# Patient Record
Sex: Male | Born: 1935 | Race: White | Hispanic: No
Health system: Southern US, Community
[De-identification: ages and names within clinical notes are randomized; demographics above are authoritative.]

---

## 2007-10-20 ENCOUNTER — Ambulatory Visit: Payer: Self-pay | Admitting: Unknown Physician Specialty

## 2007-10-20 ENCOUNTER — Other Ambulatory Visit: Payer: Self-pay

## 2007-10-21 ENCOUNTER — Ambulatory Visit: Payer: Self-pay | Admitting: Unknown Physician Specialty

## 2007-10-21 ENCOUNTER — Ambulatory Visit: Payer: Self-pay | Admitting: Oncology

## 2007-10-29 ENCOUNTER — Ambulatory Visit: Payer: Self-pay | Admitting: Oncology

## 2007-11-05 ENCOUNTER — Ambulatory Visit: Payer: Self-pay | Admitting: Oncology

## 2007-11-14 ENCOUNTER — Ambulatory Visit: Payer: Self-pay | Admitting: Oncology

## 2007-11-20 ENCOUNTER — Ambulatory Visit: Payer: Self-pay | Admitting: Oncology

## 2007-12-21 ENCOUNTER — Ambulatory Visit: Payer: Self-pay | Admitting: Oncology

## 2008-01-20 ENCOUNTER — Ambulatory Visit: Payer: Self-pay | Admitting: Oncology

## 2008-02-20 ENCOUNTER — Ambulatory Visit: Payer: Self-pay | Admitting: Oncology

## 2008-04-19 ENCOUNTER — Ambulatory Visit: Payer: Self-pay | Admitting: Oncology

## 2008-05-12 ENCOUNTER — Ambulatory Visit: Payer: Self-pay | Admitting: Oncology

## 2008-05-20 ENCOUNTER — Ambulatory Visit: Payer: Self-pay | Admitting: Oncology

## 2008-10-20 ENCOUNTER — Ambulatory Visit: Payer: Self-pay | Admitting: Radiation Oncology

## 2008-11-04 ENCOUNTER — Ambulatory Visit: Payer: Self-pay | Admitting: Unknown Physician Specialty

## 2008-11-12 ENCOUNTER — Ambulatory Visit: Payer: Self-pay | Admitting: Oncology

## 2008-11-19 ENCOUNTER — Ambulatory Visit: Payer: Self-pay | Admitting: Radiation Oncology

## 2009-04-19 ENCOUNTER — Ambulatory Visit: Payer: Self-pay | Admitting: Radiation Oncology

## 2009-05-12 ENCOUNTER — Ambulatory Visit: Payer: Self-pay | Admitting: Radiation Oncology

## 2009-05-20 ENCOUNTER — Ambulatory Visit: Payer: Self-pay | Admitting: Radiation Oncology

## 2009-10-03 ENCOUNTER — Ambulatory Visit: Payer: Self-pay | Admitting: Internal Medicine

## 2009-10-03 ENCOUNTER — Ambulatory Visit: Payer: Self-pay | Admitting: Oncology

## 2009-10-06 ENCOUNTER — Ambulatory Visit: Payer: Self-pay | Admitting: Oncology

## 2009-10-19 ENCOUNTER — Ambulatory Visit: Payer: Self-pay | Admitting: Unknown Physician Specialty

## 2009-10-20 ENCOUNTER — Ambulatory Visit: Payer: Self-pay | Admitting: Oncology

## 2009-10-20 ENCOUNTER — Ambulatory Visit: Payer: Self-pay | Admitting: Internal Medicine

## 2009-10-26 ENCOUNTER — Ambulatory Visit: Payer: Self-pay | Admitting: Unknown Physician Specialty

## 2009-11-22 ENCOUNTER — Ambulatory Visit: Payer: Self-pay | Admitting: Unknown Physician Specialty

## 2010-03-16 ENCOUNTER — Ambulatory Visit: Payer: Self-pay | Admitting: Unknown Physician Specialty

## 2010-03-28 ENCOUNTER — Ambulatory Visit: Payer: Self-pay | Admitting: Unknown Physician Specialty

## 2010-07-28 ENCOUNTER — Ambulatory Visit: Payer: Self-pay | Admitting: Oncology

## 2010-08-02 ENCOUNTER — Ambulatory Visit: Payer: Self-pay | Admitting: Oncology

## 2010-08-20 ENCOUNTER — Ambulatory Visit: Payer: Self-pay | Admitting: Oncology

## 2010-09-20 ENCOUNTER — Ambulatory Visit: Payer: Self-pay | Admitting: Oncology

## 2010-10-21 ENCOUNTER — Ambulatory Visit: Payer: Self-pay | Admitting: Oncology

## 2010-11-20 ENCOUNTER — Ambulatory Visit: Payer: Self-pay | Admitting: Oncology

## 2010-12-21 ENCOUNTER — Ambulatory Visit: Payer: Self-pay | Admitting: Oncology

## 2011-01-08 ENCOUNTER — Ambulatory Visit: Payer: Self-pay | Admitting: Oncology

## 2011-01-09 ENCOUNTER — Ambulatory Visit: Payer: Self-pay | Admitting: Oncology

## 2011-01-20 ENCOUNTER — Ambulatory Visit: Payer: Self-pay | Admitting: Oncology

## 2011-05-19 ENCOUNTER — Inpatient Hospital Stay: Payer: Self-pay | Admitting: Internal Medicine

## 2011-05-19 LAB — COMPREHENSIVE METABOLIC PANEL
Anion Gap: 13 (ref 7–16)
Bilirubin,Total: 0.7 mg/dL (ref 0.2–1.0)
Calcium, Total: 8.7 mg/dL (ref 8.5–10.1)
Chloride: 100 mmol/L (ref 98–107)
Co2: 29 mmol/L (ref 21–32)
Creatinine: 1.18 mg/dL (ref 0.60–1.30)
EGFR (African American): >60
EGFR (Non-African Amer.): >60
Glucose: 154 mg/dL — ABNORMAL HIGH (ref 65–99)
Osmolality: 292 (ref 275–301)
Potassium: 3.6 mmol/L (ref 3.5–5.1)
SGOT(AST): 22 U/L (ref 15–37)
SGPT (ALT): 15 U/L

## 2011-05-19 LAB — TROPONIN I: Troponin-I: <0.02 ng/mL

## 2011-05-19 LAB — CK TOTAL AND CKMB (NOT AT ARMC)
CK, Total: 121 U/L (ref 35–232)
CK-MB: 0.8 ng/mL (ref 0.5–3.6)

## 2011-05-19 LAB — CBC
HCT: 31.3 % — ABNORMAL LOW (ref 40.0–52.0)
MCV: 95 fL (ref 80–100)

## 2011-05-20 LAB — BASIC METABOLIC PANEL
Anion Gap: 10 (ref 7–16)
BUN: 22 mg/dL — ABNORMAL HIGH (ref 7–18)
Calcium, Total: 8 mg/dL — ABNORMAL LOW (ref 8.5–10.1)
Chloride: 106 mmol/L (ref 98–107)
Co2: 27 mmol/L (ref 21–32)
EGFR (African American): >60
EGFR (Non-African Amer.): >60

## 2011-05-20 LAB — CBC WITH DIFFERENTIAL/PLATELET
Basophil #: 0 10*3/uL (ref 0.0–0.1)
Basophil %: 0 %
Eosinophil #: 0 10*3/uL (ref 0.0–0.7)
Eosinophil %: 0 %
HCT: 26.8 % — ABNORMAL LOW (ref 40.0–52.0)
Lymphocyte %: 4.1 %
MCH: 32.3 pg (ref 26.0–34.0)
MCHC: 33.8 g/dL (ref 32.0–36.0)
Monocyte #: 1.2 10*3/uL — ABNORMAL HIGH (ref 0.0–0.7)
Neutrophil #: 10.6 10*3/uL — ABNORMAL HIGH (ref 1.4–6.5)
Platelet: 114 10*3/uL — ABNORMAL LOW (ref 150–440)
RBC: 2.8 10*6/uL — ABNORMAL LOW (ref 4.40–5.90)
RDW: 14.6 % — ABNORMAL HIGH (ref 11.5–14.5)
WBC: 12.3 10*3/uL — ABNORMAL HIGH (ref 3.8–10.6)

## 2011-05-20 LAB — HEMOGLOBIN A1C: Hemoglobin A1C: 5.5 % (ref 4.2–6.3)

## 2011-05-21 ENCOUNTER — Ambulatory Visit: Payer: Self-pay | Admitting: Internal Medicine

## 2011-05-21 LAB — CBC WITH DIFFERENTIAL/PLATELET
Eosinophil #: 0 10*3/uL (ref 0.0–0.7)
HCT: 26.3 % — ABNORMAL LOW (ref 40.0–52.0)
Lymphocyte %: 7.7 %
MCH: 32.9 pg (ref 26.0–34.0)
MCHC: 33.7 g/dL (ref 32.0–36.0)
MCV: 98 fL (ref 80–100)
Neutrophil #: 5.8 10*3/uL (ref 1.4–6.5)
Neutrophil %: 81.5 %
RBC: 2.7 10*6/uL — ABNORMAL LOW (ref 4.40–5.90)
RDW: 14.6 % — ABNORMAL HIGH (ref 11.5–14.5)

## 2011-05-21 LAB — BASIC METABOLIC PANEL
Anion Gap: 9 (ref 7–16)
BUN: 18 mg/dL (ref 7–18)
Calcium, Total: 7.8 mg/dL — ABNORMAL LOW (ref 8.5–10.1)
Co2: 28 mmol/L (ref 21–32)
Creatinine: 0.77 mg/dL (ref 0.60–1.30)
Glucose: 103 mg/dL — ABNORMAL HIGH (ref 65–99)
Osmolality: 291 (ref 275–301)
Potassium: 2.7 mmol/L — ABNORMAL LOW (ref 3.5–5.1)
Sodium: 145 mmol/L (ref 136–145)

## 2011-05-22 LAB — BASIC METABOLIC PANEL
Anion Gap: 8 (ref 7–16)
BUN: 14 mg/dL (ref 7–18)
Calcium, Total: 7.7 mg/dL — ABNORMAL LOW (ref 8.5–10.1)
Chloride: 114 mmol/L — ABNORMAL HIGH (ref 98–107)
Co2: 24 mmol/L (ref 21–32)
Creatinine: 0.75 mg/dL (ref 0.60–1.30)
EGFR (African American): >60
EGFR (Non-African Amer.): >60
Glucose: 113 mg/dL — ABNORMAL HIGH (ref 65–99)
Osmolality: 292 (ref 275–301)

## 2011-05-22 LAB — EXPECTORATED SPUTUM ASSESSMENT W REFEX TO RESP CULTURE

## 2011-05-24 LAB — CULTURE, BLOOD (SINGLE)

## 2011-06-20 ENCOUNTER — Ambulatory Visit: Payer: Self-pay | Admitting: Internal Medicine

## 2011-06-20 DEATH — deceased

## 2012-04-19 IMAGING — CR DG CHEST 2V
1 series · 4 of 4 positions shown · non-contrast
Comparison: none

REASON FOR EXAM: Shortness of Breath
COMMENTS:   May transport without cardiac monitor

[Series 1: x chest ap · 0.14mm/px · 4 of 4 slices shown]
[im 1/4]
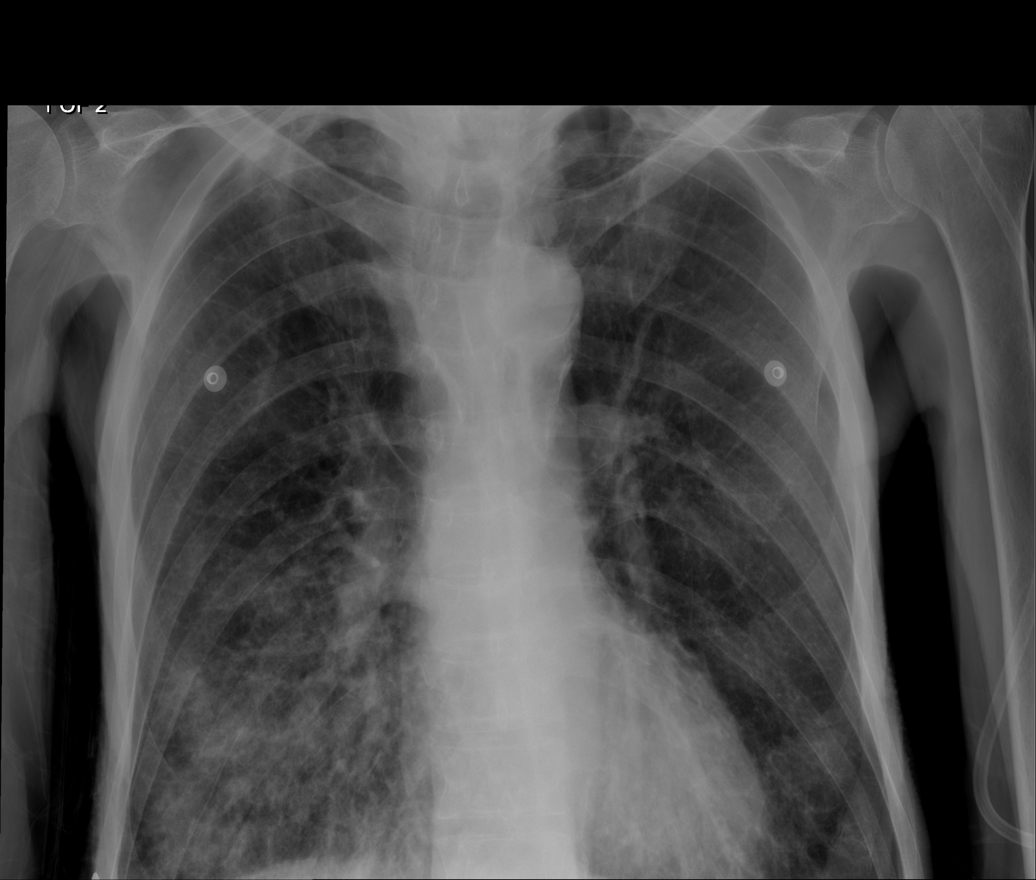
[im 2/4]
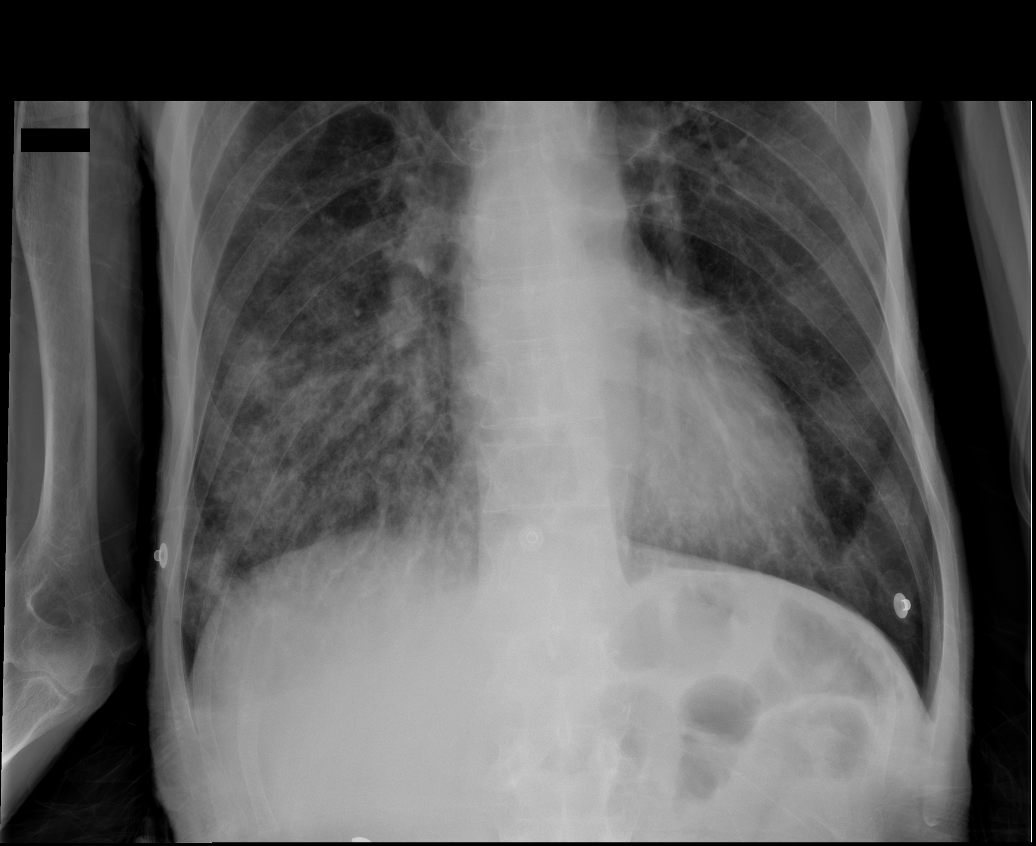
[im 3/4]
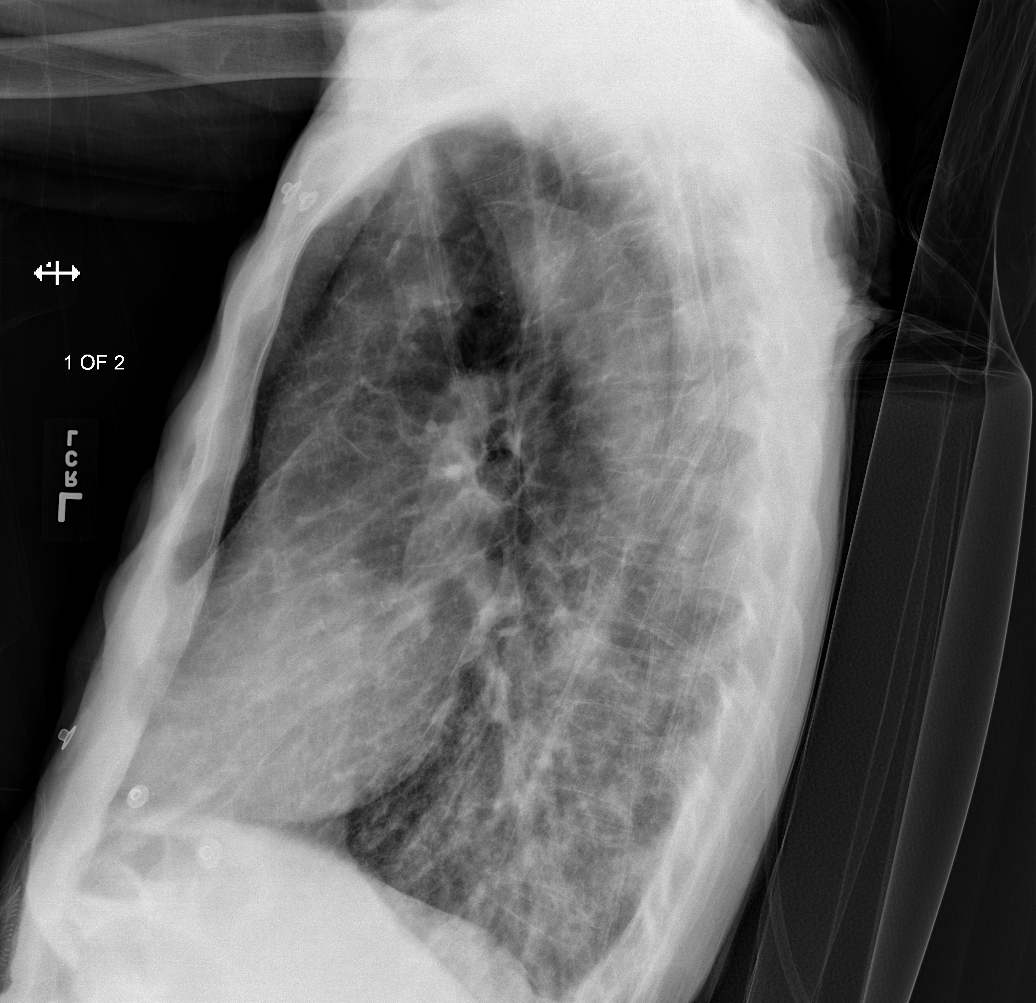
[im 4/4]
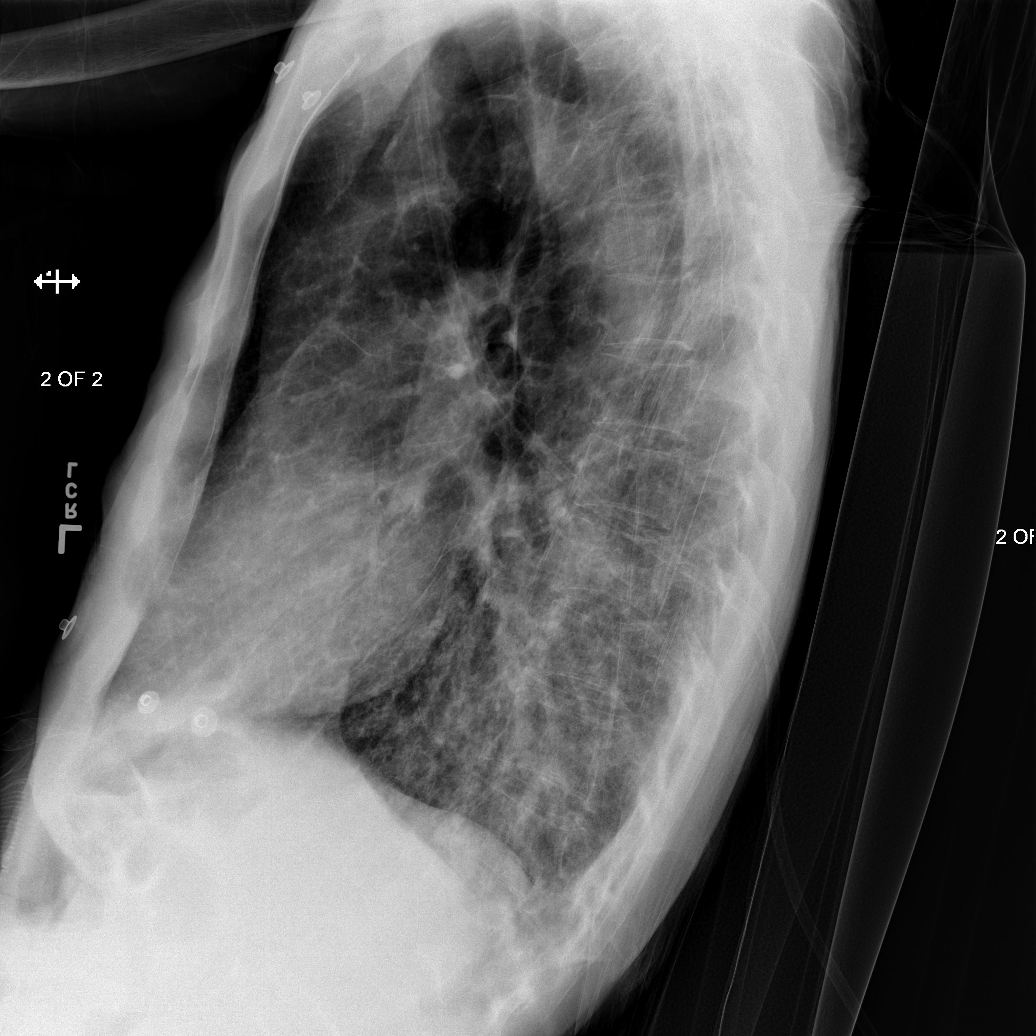

[4 of 4 positions shown; findings below may reference images not displayed]

PROCEDURE:     DXR - DXR CHEST PA (OR AP) AND LATERAL  - May 19, 2011  [DATE]

RESULT:     Comparison is made to the study 12 May, 2008.

The lungs are hyperinflated COPD changes present. The cardiac silhouette is
normal. Atherosclerotic calcification is noted within the aortic arch. Right
lower lobe pneumonia is present. There is no effusion.
IMPRESSION: 1. COPD with right lower lobe pneumonia.

## 2012-04-22 IMAGING — CR DG ABDOMEN 1V
1 series · 1 of 1 positions shown · non-contrast
Comparison: none

REASON FOR EXAM: Abdominal pain, upper quadrant rebound tenderness
COMMENTS:   Bedside (portable):Y

[ap]
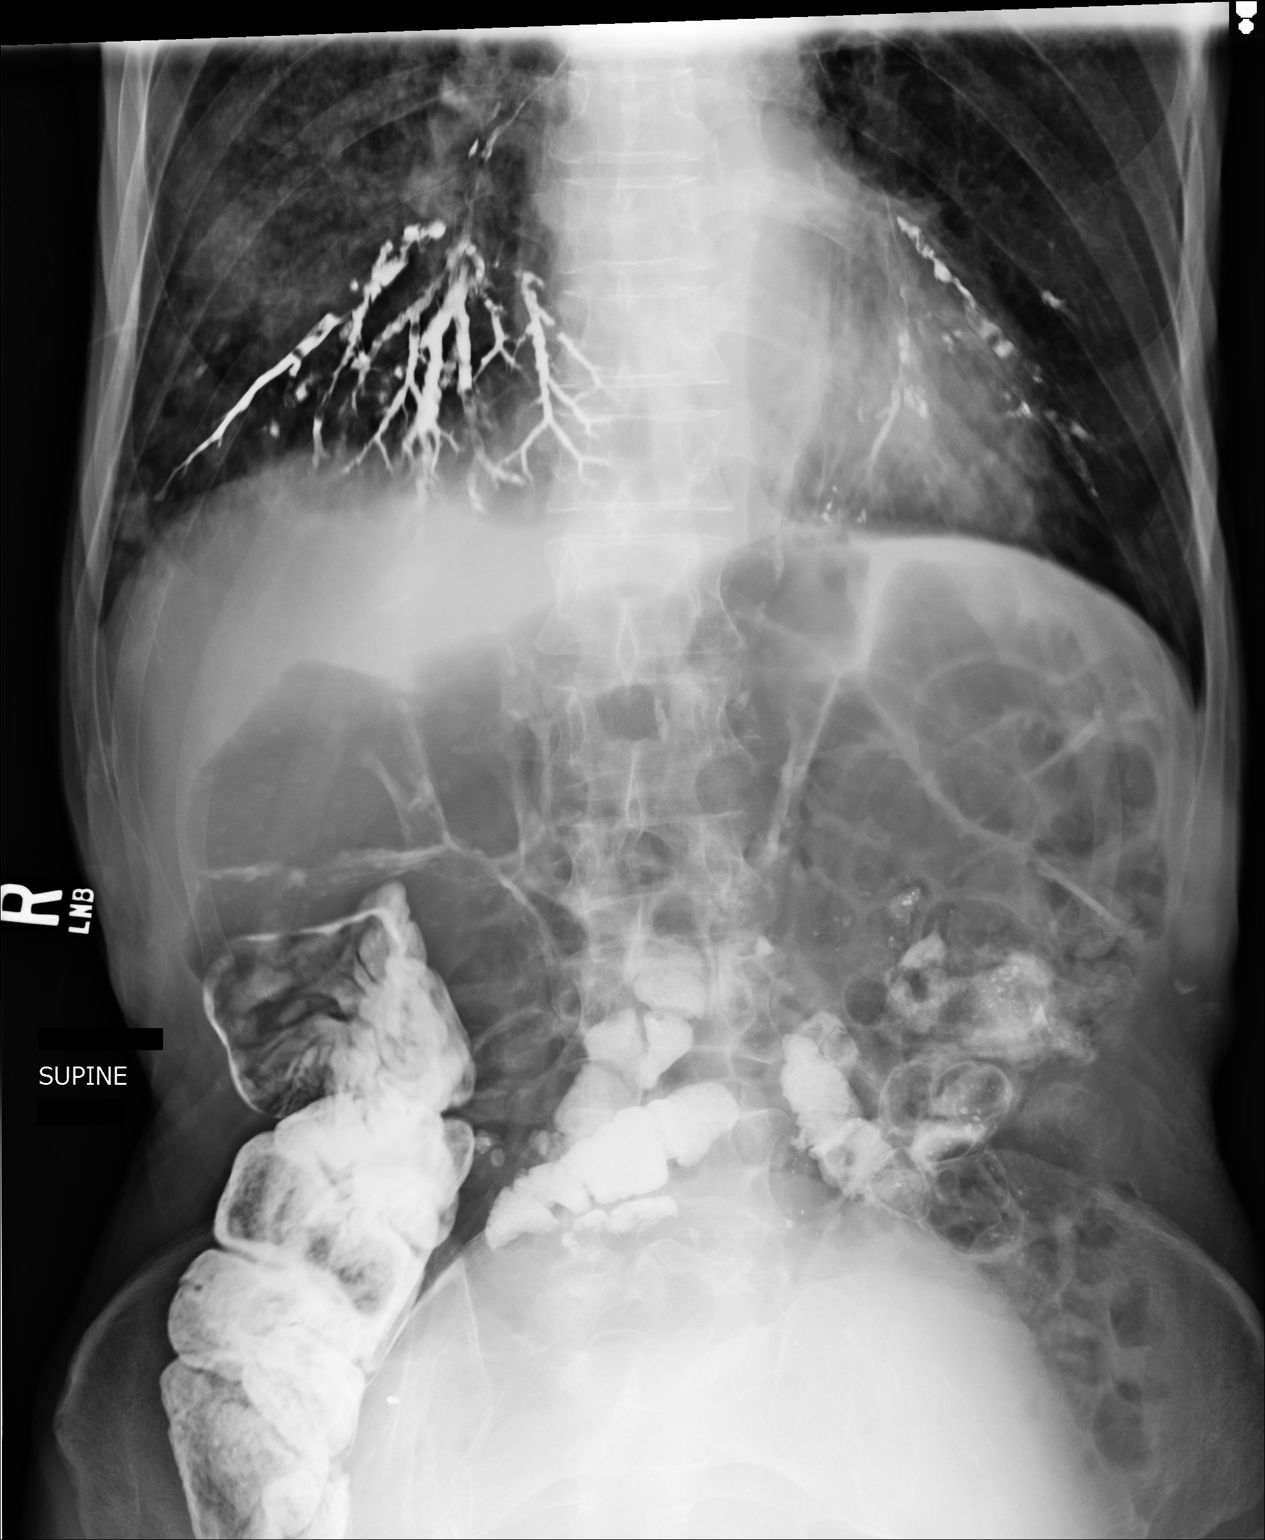

[1 of 1 positions shown; findings below may reference images not displayed]

PROCEDURE:     DXR - DXR ABDOMEN AP ONLY  - May 22, 2011  [DATE]

RESULT:     Barium is present within the bronchial structures in the lower
lobes. There some patchy increased density in the right lung base suggestive
of pneumonia. Aspiration pneumonia is not excluded. The barium from the
stomach the previous barium swallow is present within loops of bowel. There
is no definite obstruction. There are some air-filled loops of small bowel
present. Images obtained with the patient in a supine position and therefore
free air would be difficult to evaluate.
IMPRESSION: No definite bowel obstruction. There is barium in the right
colon and in loops of small bowel. Barium bronchogram right lower lobe
greater than left lower lobe. Right lung base pneumonia present.

## 2014-06-13 NOTE — Discharge Summary (Signed)
PATIENT NAME:  Travis Herman, Travis Herman DATE OF BIRTH:  04-Dec-1929  DATE OF ADMISSION:  05/19/2011 DATE OF DISCHARGE:  05/22/2011  ADMITTING PHYSICIAN: Alford Highlandichard Wieting, MD  DISCHARGING PHYSICIAN: Enid Baasadhika Deakin Lacek, MD  PRIMARY CARE PHYSICIAN: Dewaine Oatsenny Tate, MD  CONSULTANTS: Ned GraceNancy Phifer, MD - Palliative Care  DISCHARGE DIAGNOSES:  1. Systemic antiinflammatory response syndrome. 2. Aspiration pneumonia.  3. Severe dysphagia with frank aspiration on modified barium swallow study.  4. Urinary retention requiring Foley catheter.  5. Hypertension.  6. Gastroesophageal reflux disease.  7. Peripheral vascular disease.  8. Recurrent laryngeal cancer.  9. Constipation.  10. Chronic respiratory failure, on 2 liters home oxygen.  11. Chronic obstructive pulmonary disease.   DISCHARGE MEDICATIONS:  1. Roxanol 20 mg/mL, 5 to 10 mg p.o. sublingual every 1 to 2 hours p.r.n.  2. Ativan 0.5 to 1 mg p.o. sublingual every 2 to 4 hours p.r.n.  3. Ranitidine 150 mg p.o. twice a day. 4. ABHR suppository one per rectum every 4 to 6 hours p.r.n.  5. Augmentin 500 mg p.o. twice a day until 05/29/2011.  6. Scopolamine patch one patch transdermal every 3 days.  7. Flomax 0.4 mg p.o. daily.  8. Trazodone 100 mg p.o. at bedtime.  9. Metoprolol 25 mg p.o. twice a day.   DISCHARGE HOME OXYGEN: 2 liters.   DISCHARGE DIET: Pleasure feeds and sips of water as tolerated.  FOLLOWUP INSTRUCTIONS: The patient will be transferred to the Hospice Home.  LABS AND IMAGING STUDIES: Sodium 146, potassium 4.2, chloride 114, bicarbonate 24, BUN 14, creatinine 0.75, glucose 113, and calcium 7.7. WBC 7.1, hemoglobin 8.9, hematocrit 26.3, and platelet count 125.  Barium swallow study was incomplete because of frank aspiration with large amounts of barium.   Abdominal x-ray showed no bowel obstruction. Barium present in right colon and loops of small bowel.   Barium bronchogram with right lower lobe greater than  left lower lobe and right lung base pneumonia present.   Sputum cultures are growing Streptococcus agalactiae.   Hemoglobin A1c 5.5.   Blood cultures remained negative.   Chest x-ray on admission showed right lower lobe pneumonia and chronic obstructive pulmonary disease.  BRIEF HOSPITAL COURSE: Mr. Travis Herman is a 81 year old cachetic looking Caucasian male with past medical history of recurrent laryngeal cancer with hypertension, gastroesophageal reflux disease, and chronic respiratory failure on home oxygen who is followed at home by hospice. He came in for dysphagia and also cough. He was found to be in systemic antiinflammatory response syndrome with leukocytosis, tachypnea, and also possible pneumonia on chest x-ray.  1. Systemic inflammatory response syndrome secondary to aspiration pneumonia. He was placed on Zosyn and also azithromycin while in the hospital, and he is being discharged on Augmentin. Aspiration is going to be a major problem for him. He failed modified barium swallow study done in the hospital with frank aspiration of barium into the lungs. Palliative Care has seen the patient, and the patient is appropriately being discharged to the Hospice Home. The patient has refused a PEG. He was on p.o. in the hospital and can have pleasure feeds with sips of water as needed. 2. Urinary retention while in hospital. The patient has severe abdominal distention and pain and bladder scan showed greater than 1 liter of urine. Placing Foley catheter drained about 1500 mL of urine with improvement in abdominal pain. Abdominal x-ray did not show any acute findings. He is being started on Flomax.  3. Hypertension. The patient is on metoprolol at  the time of discharge.  4. Recurrent laryngeal cancer, status post chemoradiation and surgery with soft voice currently. He also has severe dysphagia causing him to have failure to thrive. The patient will be transferred to the Hospice Home today.  CODE  STATUS: DO NOT RESUSCITATE.   DISCHARGE CONDITION: Guarded with poor prognosis.   DISCHARGE DISPOSITION: Hospice Home.   TIME SPENT ON DISCHARGE: 45 minutes. ____________________________ Enid Baas, MD rk:slb D: 05/22/2011 12:13:13 ET T: 05/22/2011 17:10:05 ET JOB#: [id number]  cc: Enid Baas, MD, <Dictator> Jillene Bucks. Arlana Pouch, MD Enid Baas MD ELECTRONICALLY SIGNED 05/25/2011 13:48

## 2014-06-13 NOTE — H&P (Signed)
PATIENT NAME:  Travis Herman, Travis Herman MR#:  [id number] DATE OF BIRTH:  Oct 17, 1929  DATE OF ADMISSION:  05/19/2011  PRIMARY CARE PHYSICIAN: Dr. Arlana Pouch   CHIEF COMPLAINT: "I cannot breathe."  HISTORY OF PRESENT ILLNESS: This is a 81 year old man who is under hospice care for laryngeal cancer recurrence. He states that he cannot breathe for 3 or 4 days. He states that food gets caught, mostly solids. He is eating a regular diet. He has been fatigued. No fever, chills or sweats but does feel cold. In the Emergency Room he was found to have a right lower lobe pneumonia, leukocytosis and tachycardia and hospitalist services were contacted for further evaluation   PAST MEDICAL HISTORY:  1. Throat cancer status post radiation therapy then radiation and chemotherapy with recurrence and now under hospice care.  2. Hypertension. 3. Rapid heartbeat.  4. Gastroesophageal reflux disease.  5. Chronic pain. 6. Peripheral vascular disease.   PAST SURGICAL HISTORY:  1. Biopsies on the larynx.  2. Appendectomy.  3. AAA repair.   ALLERGIES: No known drug allergies.   MEDICATIONS:  1. Scopolamine patch every 72 hours. 2. Guaifenesin 600 mg 5 to 10 mL every 4 to 6 hours. 3. Oxycodone 10 mg every 4 to 6 hours. 4. Zyrtec 10 mg daily.  5. Gabapentin 100 mg 3 times a day.  6. Metoprolol 25 mg daily.  7. Senna Colace combination. 8. Diltiazem ER 180 mg daily.  9. Trazodone 100 mg at bedtime.  10. Oxygen 2 to 4 liters. 11. Ibuprofen p.r.n.  12. Omeprazole 20 mg daily.  13. Acetaminophen p.r.n.  14. Aspirin 81 mg daily.  15. Aspercreme p.r.n.    SOCIAL HISTORY: Positive smoker, 1 pack per day for many years. Lives alone. No alcohol. No drug use. Used to work in [employer].   FAMILY HISTORY: Both parents died in their late 61s early 63s of myocardial infarction.   REVIEW OF SYSTEMS: CONSTITUTIONAL: Positive for cold feeling at night. No fever or chills. Positive for fatigue. Steadily losing weight over the  past year. EYES: Decreased vision in today. EARS, NOSE, MOUTH, AND THROAT: Positive for sore throat, dry mouth, hoarse voice since last radiation treatment. Dysphagia to solids. CARDIOVASCULAR: No chest pain. No palpitations. RESPIRATORY: Positive for shortness of breath. Positive for cough. Positive for yellow phlegm and blood tinged. GASTROINTESTINAL: Positive for constipation. No nausea. No vomiting. No abdominal pain. No bright red blood per rectum. No melena. GENITOURINARY: No burning on urination. No hematuria. MUSCULOSKELETAL: No joint pain or muscle pain. INTEGUMENT: No rashes or eruptions. NEUROLOGIC: No fainting or blackouts. PSYCHIATRIC: No anxiety or depression. ENDOCRINE: No thyroid problems. HEMATOLOGIC/LYMPHATIC: No history of anemia.   PHYSICAL EXAMINATION:  VITAL SIGNS: Vital signs on presentation include: Temperature 96.2, pulse 147, respirations 25, blood pressure 89/50.   GENERAL: No respiratory distress.   EYES: Conjunctivae and lids normal. Pupils equal, round, and reactive to light. Extraocular movements are intact. No nystagmus.   EARS, NOSE, MOUTH, AND THROAT: Tympanic membranes no erythema. Nasal mucosa no erythema. Throat no lesions seen. Positive thrush. Lips no lesions.   NECK: No JVD. No bruits. No lymphadenopathy. No thyromegaly. No thyroid nodules palpated.   RESPIRATORY: Decreased breath sounds bilaterally. Scattered rhonchi.   CARDIOVASCULAR: S1, S2 tachycardic. No gallops, rubs, or murmurs heard. Carotid upstroke 2+ bilaterally. No bruits.   EXTREMITIES: Dorsalis pedis pulses 1+ bilaterally. No edema of the lower extremity.   ABDOMEN: Soft, nontender. No organomegaly/splenomegaly. Normoactive bowel sounds. No masses felt.  LYMPHATIC: No lymph nodes in the neck.   MUSCULOSKELETAL: No clubbing. No cyanosis on oxygen. No edema.   SKIN: Radiation changes around the neck.   NEUROLOGIC: Cranial nerves II through XII grossly intact. Deep tendon reflexes 2+  bilateral lower extremities.   PSYCHIATRIC: Patient is oriented to person, place, and time.   LABORATORY, DIAGNOSTIC, AND RADIOLOGICAL DATA: Chest x-ray shows chronic obstructive pulmonary disease with right lower lobe pneumonia. BNP 679. White blood cell count 16.3, hemoglobin and hematocrit 10.5 and 31.3, platelet count 139, glucose 154, BUN 29, creatinine 1.18, sodium 142, potassium 3.6, chloride 100, CO2 29, calcium 8.7. Liver function tests: Albumin low at 3.1. Troponin negative. EKG shows sinus tachycardia, first-degree AV block, PVCs, septal infarct age undetermined.   ASSESSMENT AND PLAN:  1. Clinical sepsis and dehydration secondary to most likely aspiration pneumonia. Will give IV Zosyn and Zithromax. Give IV fluid hydration. Blood cultures x2. Sputum culture if able.  2. Aspiration pneumonia. Will give Zosyn. Get a swallow evaluation. Put on dysphagia diet.  3. Chronic respiratory failure on chronic oxygen. Will give nebulizer treatments and treatment for pneumonia.  4. History of laryngeal cancer under hospice care. Patient is a DO NOT RESUSCITATE.  5. History of hypertension and rapid heart rate. Continue Cardizem and Toprol blood pressure depending. Will also give IV fluid hydration.  6. Gastroesophageal reflux disease. Continue omeprazole.  7. Peripheral vascular disease on aspirin.  8. Tobacco abuse. Smoking cessation counseling done, three minutes by me. Patient refused nicotine patch.  9. Thrombocytopenia and anemia. Continue to watch counts.  10. Impaired fasting glucose. Will check a hemoglobin A1c in the a.m.   TIME SPENT ON ADMISSION: 55 minutes.   CODE STATUS: Patient is a DO NOT RESUSCITATE.   ____________________________ Herschell Dimesichard J. Renae GlossWieting, MD rjw:cms D: 05/19/2011 11:34:58 ET T: 05/19/2011 11:55:18 ET JOB#: [id number]  cc: Herschell Dimesichard J. Renae GlossWieting, MD, <Dictator> Jillene Bucksenny C. Arlana Pouchate, MD Salley ScarletICHARD J Arlin Sass MD ELECTRONICALLY SIGNED 05/20/2011 7:20
